# Patient Record
Sex: Female | Born: 1965 | Race: Black or African American | Hispanic: No | Marital: Single | Smoking: Current every day smoker
Health system: Southern US, Community
[De-identification: ages and names within clinical notes are randomized; demographics above are authoritative.]

## PROBLEM LIST (undated history)

## (undated) DIAGNOSIS — I10 Essential (primary) hypertension: Secondary | ICD-10-CM

## (undated) DIAGNOSIS — R51 Headache: Secondary | ICD-10-CM

## (undated) DIAGNOSIS — Z982 Presence of cerebrospinal fluid drainage device: Secondary | ICD-10-CM

## (undated) DIAGNOSIS — I639 Cerebral infarction, unspecified: Secondary | ICD-10-CM

## (undated) DIAGNOSIS — R519 Headache, unspecified: Secondary | ICD-10-CM

## (undated) HISTORY — PX: ABDOMINAL HYSTERECTOMY: SHX81

---

## 2014-12-30 ENCOUNTER — Encounter: Payer: Self-pay | Admitting: Medical Oncology

## 2014-12-30 ENCOUNTER — Emergency Department: Payer: Self-pay

## 2014-12-30 ENCOUNTER — Emergency Department
Admission: EM | Admit: 2014-12-30 | Discharge: 2014-12-30 | Disposition: A | Discharge status: Home / Self Care | Payer: Self-pay | Attending: Emergency Medicine | Admitting: Emergency Medicine

## 2014-12-30 DIAGNOSIS — F17200 Nicotine dependence, unspecified, uncomplicated: Secondary | ICD-10-CM | POA: Insufficient documentation

## 2014-12-30 DIAGNOSIS — G43009 Migraine without aura, not intractable, without status migrainosus: Secondary | ICD-10-CM

## 2014-12-30 DIAGNOSIS — I10 Essential (primary) hypertension: Secondary | ICD-10-CM | POA: Insufficient documentation

## 2014-12-30 DIAGNOSIS — Y658 Other specified misadventures during surgical and medical care: Secondary | ICD-10-CM | POA: Insufficient documentation

## 2014-12-30 DIAGNOSIS — G43909 Migraine, unspecified, not intractable, without status migrainosus: Secondary | ICD-10-CM | POA: Insufficient documentation

## 2014-12-30 DIAGNOSIS — T8509XA Other mechanical complication of ventricular intracranial (communicating) shunt, initial encounter: Secondary | ICD-10-CM | POA: Insufficient documentation

## 2014-12-30 HISTORY — DX: Essential (primary) hypertension: I10

## 2014-12-30 HISTORY — DX: Headache, unspecified: R51.9

## 2014-12-30 HISTORY — DX: Headache: R51

## 2014-12-30 HISTORY — DX: Presence of cerebrospinal fluid drainage device: Z98.2

## 2014-12-30 HISTORY — DX: Cerebral infarction, unspecified: I63.9

## 2014-12-30 LAB — COMPREHENSIVE METABOLIC PANEL
ALK PHOS: 69 U/L (ref 38–126)
ALT: 14 U/L (ref 14–54)
AST: 11 U/L — AB (ref 15–41)
Albumin: 4 g/dL (ref 3.5–5.0)
Anion gap: 7 (ref 5–15)
BILIRUBIN TOTAL: 0.5 mg/dL (ref 0.3–1.2)
BUN: 7 mg/dL (ref 6–20)
CALCIUM: 9.3 mg/dL (ref 8.9–10.3)
CO2: 25 mmol/L (ref 22–32)
Chloride: 106 mmol/L (ref 101–111)
Creatinine, Ser: 0.58 mg/dL (ref 0.44–1.00)
GFR calc Af Amer: >60 mL/min (ref >60)
Glucose, Bld: 235 mg/dL — ABNORMAL HIGH (ref 65–99)
POTASSIUM: 3.9 mmol/L (ref 3.5–5.1)
Sodium: 138 mmol/L (ref 135–145)
TOTAL PROTEIN: 7.9 g/dL (ref 6.5–8.1)

## 2014-12-30 LAB — CBC
HEMATOCRIT: 40.1 % (ref 35.0–47.0)
HEMOGLOBIN: 12.9 g/dL (ref 12.0–16.0)
MCH: 25.7 pg — AB (ref 26.0–34.0)
MCHC: 32.1 g/dL (ref 32.0–36.0)
MCV: 80 fL (ref 80.0–100.0)
Platelets: 227 10*3/uL (ref 150–440)
RBC: 5.01 MIL/uL (ref 3.80–5.20)
RDW: 14.7 % — ABNORMAL HIGH (ref 11.5–14.5)
WBC: 8.9 10*3/uL (ref 3.6–11.0)

## 2014-12-30 LAB — DIFFERENTIAL
BASOS ABS: 0.1 10*3/uL (ref 0–0.1)
Basophils Relative: 1 %
EOS ABS: 0.1 10*3/uL (ref 0–0.7)
EOS PCT: 1 %
LYMPHS ABS: 2.4 10*3/uL (ref 1.0–3.6)
LYMPHS PCT: 27 %
MONOS PCT: 6 %
Monocytes Absolute: 0.6 10*3/uL (ref 0.2–0.9)
Neutro Abs: 5.8 10*3/uL (ref 1.4–6.5)
Neutrophils Relative %: 65 %

## 2014-12-30 LAB — GLUCOSE, CAPILLARY: Glucose-Capillary: 216 mg/dL — ABNORMAL HIGH (ref 65–99)

## 2014-12-30 LAB — PROTIME-INR
INR: 0.97
Prothrombin Time: 13.1 seconds (ref 11.4–15.0)

## 2014-12-30 LAB — TROPONIN I

## 2014-12-30 LAB — APTT: aPTT: 31 seconds (ref 24–36)

## 2014-12-30 MED ORDER — DIPHENHYDRAMINE HCL 50 MG/ML IJ SOLN
25.0000 mg | Freq: Once | INTRAMUSCULAR | Status: AC
  Administered 2014-12-30: 25 mg via INTRAVENOUS
  Filled 2014-12-30: qty 1

## 2014-12-30 MED ORDER — METOCLOPRAMIDE HCL 5 MG/ML IJ SOLN
20.0000 mg | Freq: Once | INTRAVENOUS | Status: AC
  Administered 2014-12-30: 20 mg via INTRAVENOUS
  Filled 2014-12-30 (×2): qty 4

## 2014-12-30 MED ORDER — DEXAMETHASONE SODIUM PHOSPHATE 10 MG/ML IJ SOLN
10.0000 mg | Freq: Once | INTRAMUSCULAR | Status: AC
  Administered 2014-12-30: 10 mg via INTRAVENOUS
  Filled 2014-12-30: qty 1

## 2014-12-30 MED ORDER — KETOROLAC TROMETHAMINE 30 MG/ML IJ SOLN
15.0000 mg | Freq: Once | INTRAMUSCULAR | Status: AC
  Administered 2014-12-30: 15 mg via INTRAVENOUS
  Filled 2014-12-30: qty 1

## 2014-12-30 MED ORDER — SODIUM CHLORIDE 0.9 % IV BOLUS (SEPSIS)
1000.0000 mL | INTRAVENOUS | Status: AC
Start: 2014-12-30 — End: 2014-12-30
  Administered 2014-12-30: 1000 mL via INTRAVENOUS

## 2014-12-30 MED ORDER — METHYLPREDNISOLONE 4 MG PO TBPK: ORAL_TABLET | ORAL | Status: AC

## 2014-12-30 NOTE — Discharge Instructions (Signed)
As we discussed, we spoke by phone with Dr. Lanae Boast, a neurosurgeon at [facility] in [place].  Fortunately he explained that while close follow up in clinic is recommended, it is not necessary for you to be seen at [facility].  He recommended the prescription for prednisone that we are providing and that you call the neurosurgical clinic at 9:00am tomorrow to schedule the next available follow up appointment.  They will help you determine who was your original surgeon and who can help you follow up.  In the meantime, drink plenty of fluids to stay hydrated since the issue may be an overdrainage of your CSF - staying hydrated is important.  Take all your regular medications including over-the-counter ibuprofen and Tylenol as needed.   Migraine Headache A migraine headache is an intense, throbbing pain on one or both sides of your head. A migraine can last for 30 minutes to several hours. CAUSES  The exact cause of a migraine headache is not always known. However, a migraine may be caused when nerves in the brain become irritated and release chemicals that cause inflammation. This causes pain. Certain things may also trigger migraines, such as:  Alcohol.  Smoking.  Stress.  Menstruation.  Aged cheeses.  Foods or drinks that contain nitrates, glutamate, aspartame, or tyramine.  Lack of sleep.  Chocolate.  Caffeine.  Hunger.  Physical exertion.  Fatigue.  Medicines used to treat chest pain (nitroglycerine), birth control pills, estrogen, and some blood pressure medicines. SIGNS AND SYMPTOMS  Pain on one or both sides of your head.  Pulsating or throbbing pain.  Severe pain that prevents daily activities.  Pain that is aggravated by any physical activity.  Nausea, vomiting, or both.  Dizziness.  Pain with exposure to bright lights, loud noises, or activity.  General sensitivity to bright lights, loud noises, or smells. Before you get a migraine, you may get warning  signs that a migraine is coming (aura). An aura may include:  Seeing flashing lights.  Seeing bright spots, halos, or zigzag lines.  Having tunnel vision or blurred vision.  Having feelings of numbness or tingling.  Having trouble talking.  Having muscle weakness. DIAGNOSIS  A migraine headache is often diagnosed based on:  Symptoms.  Physical exam.  A CT scan or MRI of your head. These imaging tests cannot diagnose migraines, but they can help rule out other causes of headaches. TREATMENT Medicines may be given for pain and nausea. Medicines can also be given to help prevent recurrent migraines.  HOME CARE INSTRUCTIONS  Only take over-the-counter or prescription medicines for pain or discomfort as directed by your health care provider. The use of long-term narcotics is not recommended.  Lie down in a dark, quiet room when you have a migraine.  Keep a journal to find out what may trigger your migraine headaches. For example, write down:  What you eat and drink.  How much sleep you get.  Any change to your diet or medicines.  Limit alcohol consumption.  Quit smoking if you smoke.  Get 7-9 hours of sleep, or as recommended by your health care provider.  Limit stress.  Keep lights dim if bright lights bother you and make your migraines worse. SEEK IMMEDIATE MEDICAL CARE IF:   Your migraine becomes severe.  You have a fever.  You have a stiff neck.  You have vision loss.  You have muscular weakness or loss of muscle control.  You start losing your balance or have trouble walking.  You  feel faint or pass out.  You have severe symptoms that are different from your first symptoms. MAKE SURE YOU:   Understand these instructions.  Will watch your condition.  Will get help right away if you are not doing well or get worse.   This information is not intended to replace advice given to you by your health care provider. Make sure you discuss any questions you  have with your health care provider.   Document Released: 12/19/2004 Document Revised: 01/09/2014 Document Reviewed: 08/26/2012 Elsevier Interactive Patient Education 2016 Elsevier Inc.  Ventriculoperitoneal Shunt Home Guide A ventriculoperitoneal (VP) shunt is a small, plastic tube that is used to drain fluid from your brain into a sac in your belly (peritoneum). The peritoneum absorbs this fluid and gets rid of it. Normally, your brain releases the fluid that cushions your brain and spine (cerebrospinal fluid, CSF). Your brain then reabsorbs it through drainage channels. If your brain's drainage channels are not working properly, fluid builds up in your brain and needs to be redirected with a shunt. You may need a VP shunt if you have too much CSF inside your brain (hydrocephalus). Your health care provider determines how much fluid needs to be drained and adjusts the settings on the shunt. Some shunt settings cannot be changed after they have been set (nonprogrammable shunt). Others can be adjusted (programmable shunt) by your health care provider. You may feel the tube behind your ear and under your skin where it passes down your neck and your chest before it enters your belly (abdomen).  When you have a shunt, you need to take certain precautions and be aware of signs that may indicate a problem with the shunt. After your shunt is placed, it is important to have the following information with you:  The contact information for the surgeon who placed your shunt.  The name and type of VP shunt that you have. WHEN WILL I HAVE MY SHUNT REMOVED? Your shunt may be temporary or permanent, depending on your condition. For some people, a VP shunt is a lifelong device. WHAT PRECAUTIONS MUST I FOLLOW?  Contact your health care provider if you have a programmable shunt and need to get an MRI for any reason. This is very important because many programmable shunts are sensitive to magnets, which are involved  in MRIs.  Tell your health care provider about your shunt before you have surgery, especially abdominal surgery. You may need to take antibiotic medicines before having a procedure.   Do not wear tight-fitting hats or headgear.  Ask your health care provider which activities are safe for you. WHAT ARE THE WARNING SIGNS OF A SHUNT MALFUNCTION? A VP shunt can malfunction or become clogged. If the shunt is not working properly, it will not drain the CSF. This can cause an increase in brain pressure. It is important to know the warning signs of a shunt malfunction because they can start suddenly. Warning signs of a malfunction include:   A headache that gets worse over time.  Vomiting without cause.  Feeling sleepier than usual.  Loss of appetite.  Low energy.  Irritability.  Personality change or confusion.  Vision changes, such as blurry vision, double vision, or loss of vision.  Swelling of the skin that runs along the path of the shunt.  A return of your original symptoms.  Trouble walking.  Inability to control your bladder (urinary incontinence).  Having a seizure. WHAT ARE THE WARNING SIGNS OF A SHUNT INFECTION? If  germs (bacteria) get into the tissue around the shunt, you can develop an infection. This can cause your shunt to stop working properly. Watch for signs of infection, such as:  Fever.  Redness or swelling of the skin along the shunt path.  Pain around the shunt or shunt tubing.  A headache or a stiff neck.  Nausea or vomiting. WHEN SHOULD I SEEK IMMEDIATE MEDICAL CARE?  When you have a VP shunt, seek medical care right away if:  You are sleepier than usual or have trouble waking up.  You vomit for no reason.  You have a fever.  You notice redness or swelling along the shunt path.  You have a headache that is getting worse.  You start to twitch or shake (seizure).  You develop vision problems.  You lose coordination or balance.  You  become irritable or start to behave abnormally.   This information is not intended to replace advice given to you by your health care provider. Make sure you discuss any questions you have with your health care provider.   Document Released: 07/08/2004 Document Revised: 01/09/2014 Document Reviewed: 05/28/2013 Elsevier Interactive Patient Education [employer] Elsevier Inc.

## 2014-12-30 NOTE — ED Notes (Signed)
Headache X 6 hours. Pt hx of migraines, "this one is bad". Sensitivity to noise and light.

## 2014-12-30 NOTE — ED Provider Notes (Signed)
[facility] Emergency Department Provider Note  ____________________________________________  Time seen: Approximately 4:12 PM  I have reviewed the triage vital signs and the nursing notes.   HISTORY  Chief Complaint Numbness    HPI Evelyn Huynh is a 49 y.o. 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  She presents today with several days of gradual onset worsening headache that feels like prior migraines that she experienced when she had pseudotumor.  She states that it has developed to severe and is accompanied with some vague and various numbness in her extremities going back to nearly 3 days ago.  She reports that she has had strokes in the past, the last one being 2 years ago.  She states that her migraine is severe, global, worse behind the eyes, and accompanied with photophobia, sensitivity to light, and nausea.   Past Medical History  Diagnosis Date  . VP (ventriculoperitoneal) shunt status   . Hypertension   . Generalized headaches   . Stroke [facility])     There are no active problems to display for this patient.   Past Surgical History  Procedure Laterality Date  . Abdominal hysterectomy      Current Outpatient Rx  Name  Route  Sig  Dispense  Refill  . aspirin-acetaminophen-caffeine (EXCEDRIN MIGRAINE) 250-250-65 MG tablet   Oral   Take 4 tablets by mouth every 6 (six) hours as needed for headache.         . methylPREDNISolone (MEDROL DOSEPAK) 4 MG TBPK tablet      Take tablets by mouth according to package instructions based on the recommended daily dosing.   21 tablet   0     Allergies Review of patient's allergies indicates no known allergies.  No family history on file.  Social History Social History  Substance Use Topics  . Smoking status: Current Every Day Smoker  . Smokeless tobacco: None  . Alcohol Use: No    Review of  Systems Constitutional: No fever/chills Eyes: Sensitivity to light and blurry vision ENT: No sore throat. Cardiovascular: Denies chest pain. Respiratory: Denies shortness of breath. Gastrointestinal: No abdominal pain.  nausea, no vomiting.  No diarrhea.  No constipation. Genitourinary: Negative for dysuria. Musculoskeletal: Negative for back pain. Skin: Negative for rash. Neurological: Severe global headache, gradual onset, feels similar to prior migraines.  Accompanied by several days of various and nonspecific numbness and tingling/weakness in her extremities.  10-point ROS otherwise negative.  ____________________________________________   PHYSICAL EXAM:  VITAL SIGNS: ED Triage Vitals  Enc Vitals Group     BP 12/30/14 1233 164/101 mmHg     Pulse Rate 12/30/14 1233 97     Resp 12/30/14 1233 18     Temp 12/30/14 1233 97.8 F (36.6 C)     Temp Source 12/30/14 1233 Oral     SpO2 12/30/14 1233 97 %     Weight 12/30/14 1233 200 lb (90.719 kg)     Height 12/30/14 1233  (1.651 m)     Head Cir --      Peak Flow --      Pain Score 12/30/14 1233 10     Pain Loc --      Pain Edu? --      Excl. in GC? --     Constitutional: Alert and oriented. Well appearing and in no acute distress. Eyes: Conjunctivae are normal. PERRL. EOMI. photophobia. Head: Atraumatic. Nose:  No congestion/rhinnorhea. Mouth/Throat: Mucous membranes are moist.  Oropharynx non-erythematous. Neck: No stridor.   Cardiovascular: Normal rate, regular rhythm. Grossly normal heart sounds.  Good peripheral circulation. Respiratory: Normal respiratory effort.  No retractions. Lungs CTAB. Gastrointestinal: Obese, Soft and nontender. No distention. No abdominal bruits. No CVA tenderness. Musculoskeletal: No lower extremity tenderness nor edema.  No joint effusions. Neurologic:  Normal speech and language. No gross focal neurologic deficits are appreciated.  Skin:  Skin is warm, dry and intact. No rash  noted. Psychiatric: Mood and affect are normal. Speech and behavior are normal.  ____________________________________________   LABS (all labs ordered are listed, but only abnormal results are displayed)  Labs Reviewed  CBC - Abnormal; Notable for the following:    MCH 25.7 (*)    RDW 14.7 (*)    All other components within normal limits  COMPREHENSIVE METABOLIC PANEL - Abnormal; Notable for the following:    Glucose, Bld 235 (*)    AST 11 (*)    All other components within normal limits  GLUCOSE, CAPILLARY - Abnormal; Notable for the following:    Glucose-Capillary 216 (*)    All other components within normal limits  PROTIME-INR  APTT  DIFFERENTIAL  TROPONIN I  CBG MONITORING, ED   ____________________________________________  EKG  ED ECG REPORT I, Leonidas Boateng, the attending physician, personally viewed and interpreted this ECG.  Date: 12/30/2014 EKG Time: 12:34 Rate: 96 Rhythm: normal sinus rhythm QRS Axis: normal Intervals: normal ST/T Wave abnormalities: normal Conduction Disutrbances: none Narrative Interpretation: unremarkable  ____________________________________________  RADIOLOGY Aura CampsI, Leiya Keesey, personally discussed these images and results by phone with the on-call radiologist and used this discussion as part of my medical decision making.    Ct Head Wo Contrast  12/30/2014  CLINICAL DATA:  RIGHT foot numbness which began 36 hours ago. This is accompanied by LEFT index finger numbness and RIGHT hand weakness. History of CVA. History of hydrocephalus with shunt. EXAM: CT HEAD WITHOUT CONTRAST TECHNIQUE: Contiguous axial images were obtained from the base of the skull through the vertex without intravenous contrast. COMPARISON:  No priors are available for comparison on the [facility] timeline. FINDINGS: No evidence for acute infarction, hemorrhage, mass lesion, hydrocephalus, or extra-axial fluid. Normal cerebral volume. No white matter disease. A shunt  catheter has been placed from a RIGHT occipital approach. The tip lies within the lateral ventricles. The ventricles are slit-like. Correlate clinically for overdrainage CSF symptoms. The calvarium is otherwise intact. There is no sinus or mastoid fluid. IMPRESSION: No acute intracranial findings are evident. Specifically, no concerning features for stroke or hemorrhage. Status post ventriculoperitoneal shunting, with imaging features suggestive of overdrainage CSF. Correlation with prior imaging studies and/or neurosurgical consultation for shunt assessment may be warranted. Electronically Signed   By: Elsie StainJohn T Curnes M.D.   On: 12/30/2014 13:09    ____________________________________________   PROCEDURES  Procedure(s) performed: None  Critical Care performed: No ____________________________________________   INITIAL IMPRESSION / ASSESSMENT AND PLAN / ED COURSE  Pertinent labs & imaging results that were available during my care of the patient were reviewed by me and considered in my medical decision making (see chart for details).  The patient's head CT is concerning for overt drainage via the VP shunt with slitlike ventricles which is also consistent with her headache and nonspecific neurological complaints.  I called and spoke by phone with Dr. Benard Rinkurnes, radiologist who interpreted the CT scan, and he feels that the pattern he is saying is very consistent with over  drainage and also make sense with the chief complaint.  She has no focal neurological deficits at this time and her largest complaint is the severe headache.  He was originally going to drive herself to [facility] that she was concerned to drive that far because of her blurry vision and the severity of her headache.  I am going to treat her with IV fluids and my typical nonnarcotic migraine cocktail but given the nature of her symptoms if her headache is not improved I will try morphine.  There is no indication for an LP and there is no  indication at this time that she has an infectious process.  We are currently in the process of trying to reach the neurosurgeon on call at [facility] to discuss the case further.  ----------------------------------------- 8:11 PM on 12/30/2014 -----------------------------------------  I spoke extensively by phone with Dr. Lanae Boast, the on-call neurosurgeon at [facility].  He assured me that the patient does not need emergent transfer to [facility] and that she can be seen in clinic in follow-up.  He recommended discharging her with a Medrol Dosepak in addition to the Decadron that I gave her in the emergency department.  He told me to stress to her the importance of calling in the morning to schedule the next outpatient visit which I did.  The patient feels much better after migraine treatment and she had a long nap while awaiting the neurosurgery consultation.  She ambulated to and from the bathroom with no difficulty and no instability.  I believe she is safe and appropriate for outpatient follow-up.  I gave my usual and customary return precautions.    ____________________________________________  FINAL CLINICAL IMPRESSION(S) / ED DIAGNOSES  Final diagnoses:  Malfunction of ventriculoperitoneal shunt, initial encounter (HCC)  Nonintractable migraine, unspecified migraine type      NEW MEDICATIONS STARTED DURING THIS VISIT:  New Prescriptions   METHYLPREDNISOLONE (MEDROL DOSEPAK) 4 MG TBPK TABLET    Take tablets by mouth according to package instructions based on the recommended daily dosing.     Loleta Rose, MD 12/30/14 2012

## 2014-12-30 NOTE — ED Notes (Signed)
Pt reports that she began having rt foot numbness , lt hand index finger numbness and rt hand weakness 36 hrs ago. Hx of  2 CVA's in past. Last one was 2014. Pt also reports headache with hx of VP shunt.

## 2017-04-12 IMAGING — CT CT HEAD W/O CM
1 series · 15 of 30 positions shown, 19 images · non-contrast
Comparison: No priors are available for comparison on the [HOSPITAL]
timeline.

CLINICAL DATA: RIGHT foot numbness which began 36 hours ago. This
is accompanied by LEFT index finger numbness and RIGHT hand
weakness. History of CVA. History of hydrocephalus with shunt.

EXAM:
CT HEAD WITHOUT CONTRAST
TECHNIQUE: Contiguous axial images were obtained from the base of the skull
through the vertex without intravenous contrast.

[Series 2: head wo · axial · 0.41mm/px · z∈[+301,+445]mm · 15 of 36 slices shown, 19 images]
[im 2/36  brain]
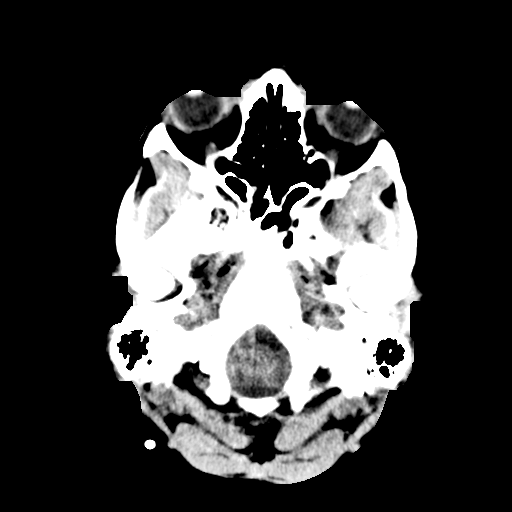
[im 2/36  bone]
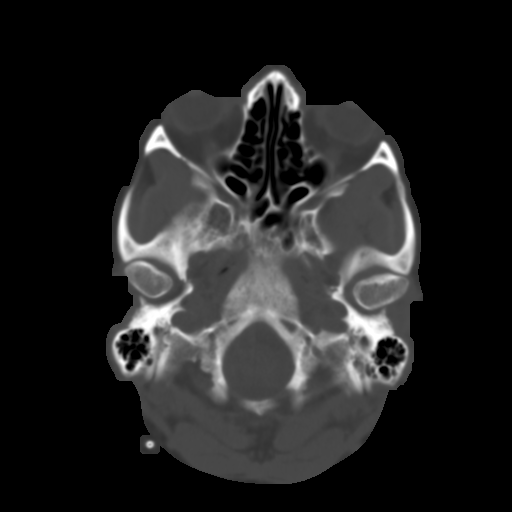
[im 4/36  brain]
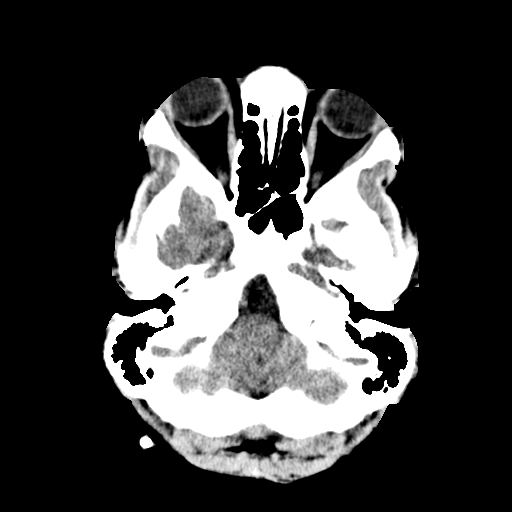
[im 7/36  brain]
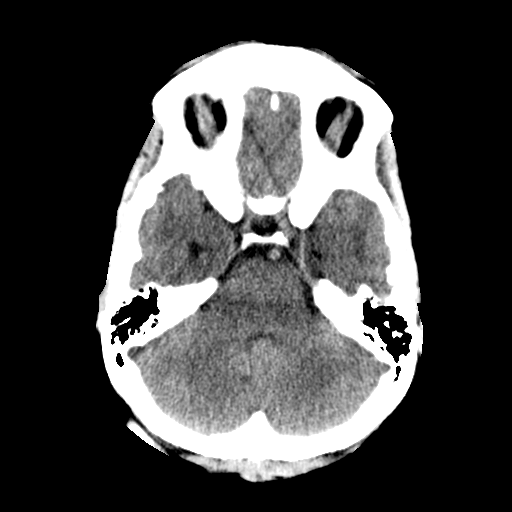
[im 9/36  brain]
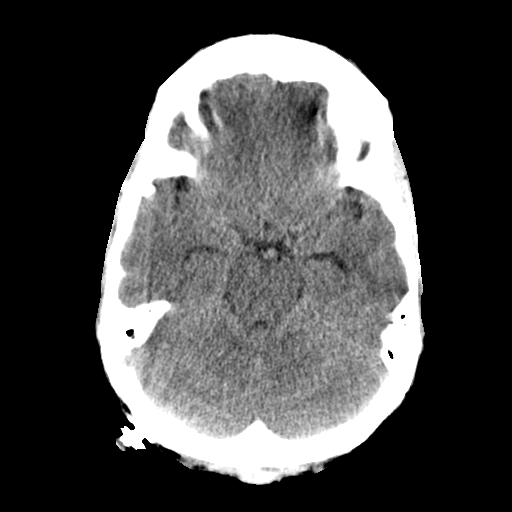
[im 11/36  brain]
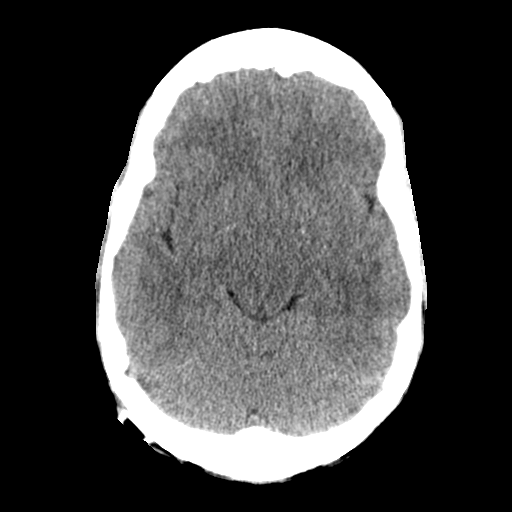
[im 11/36  bone]
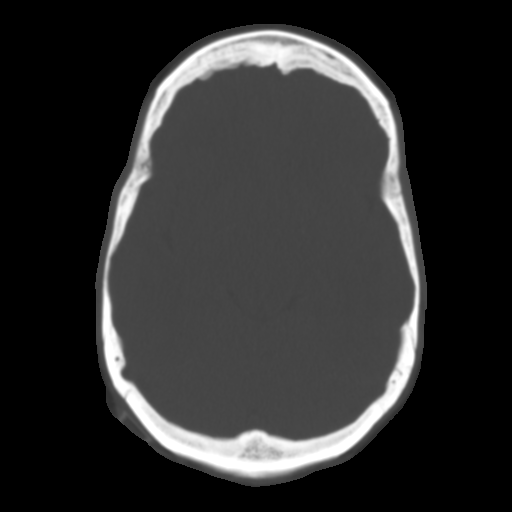
[im 14/36  brain]
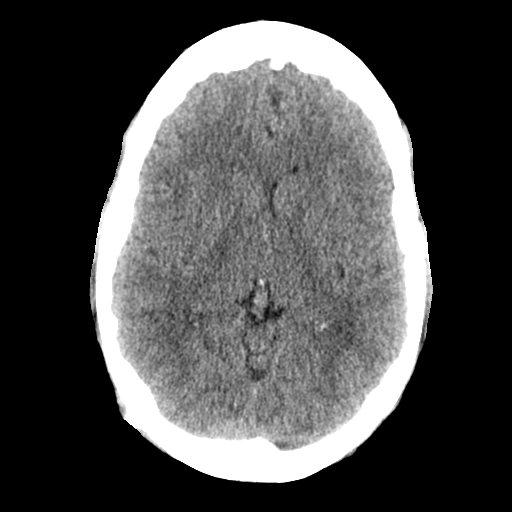
[im 16/36  brain]
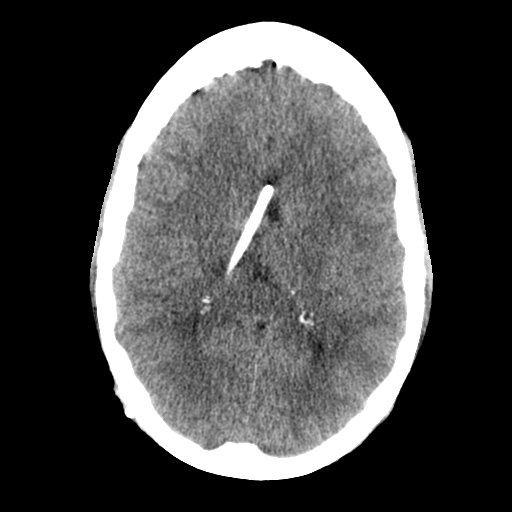
[im 19/36  brain]
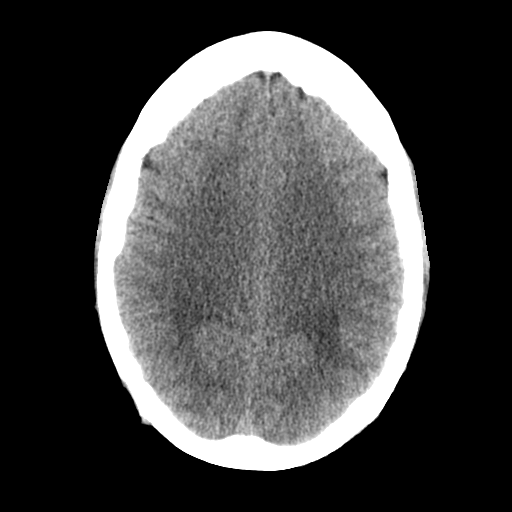
[im 20/36  brain]
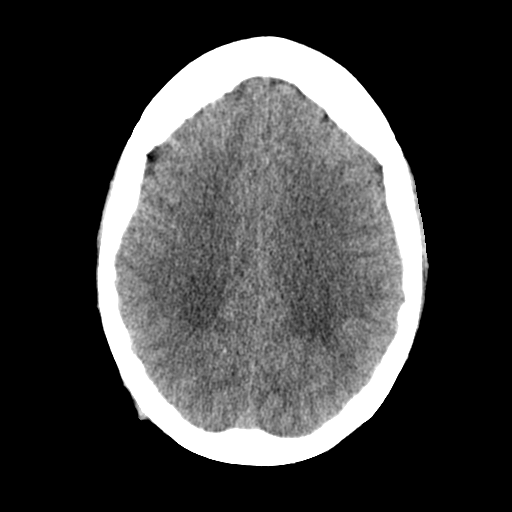
[im 20/36  bone]
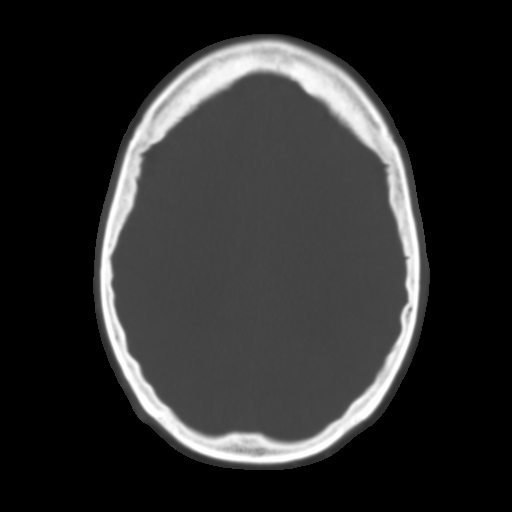
[im 22/36  brain]
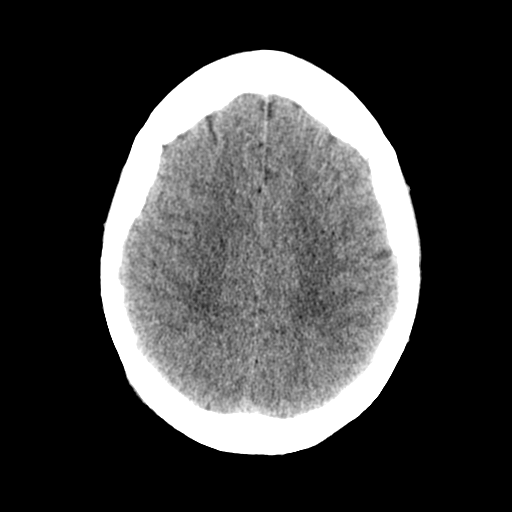
[im 25/36  brain]
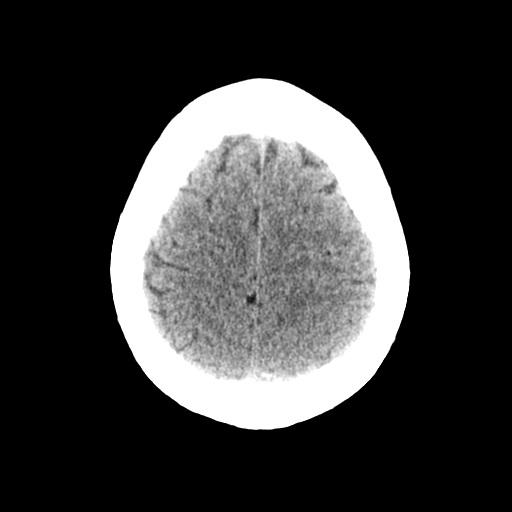
[im 27/36  brain]
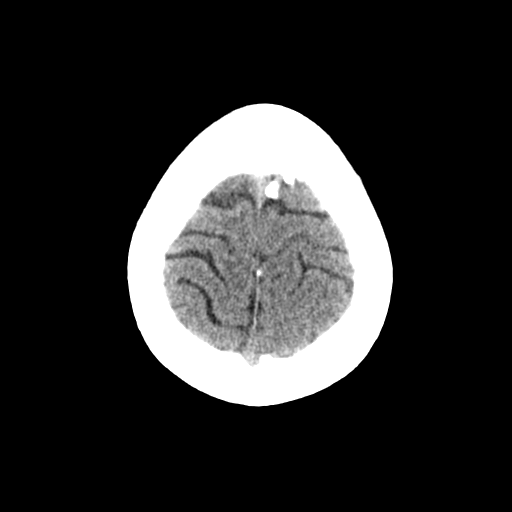
[im 29/36  brain]
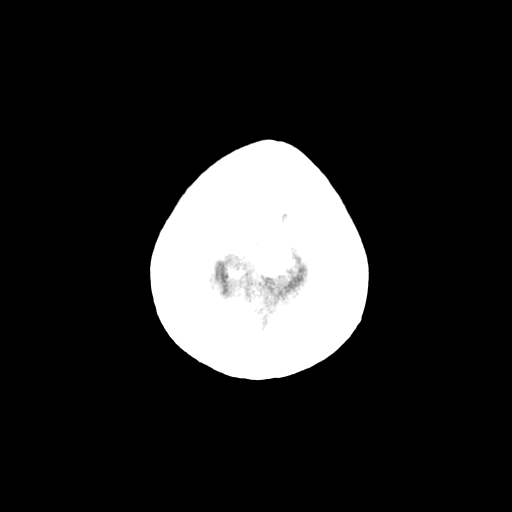
[im 29/36  bone]
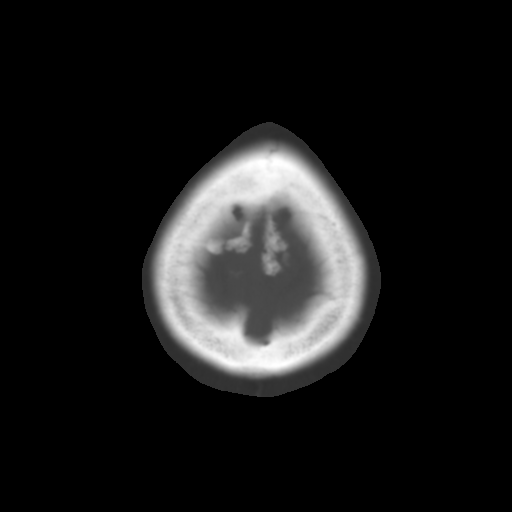
[im 32/36  brain]
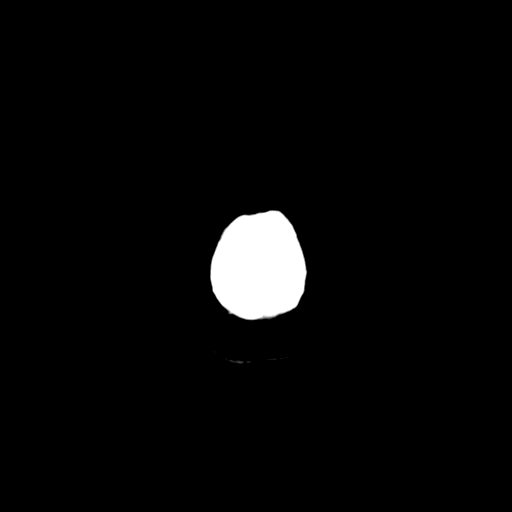
[im 34/36  brain]
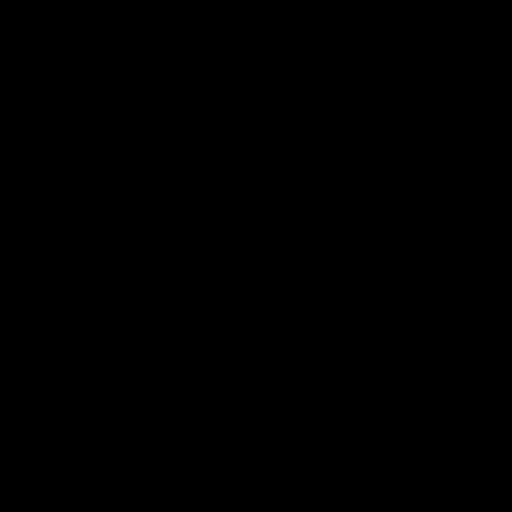

[15 of 30 positions shown; findings below may reference images not displayed]

FINDINGS: No evidence for acute infarction, hemorrhage, mass lesion,
hydrocephalus, or extra-axial fluid. Normal cerebral volume. No
white matter disease.

A shunt catheter has been placed from a RIGHT occipital approach.
The tip lies within the lateral ventricles. The ventricles are
slit-like. Correlate clinically for overdrainage CSF symptoms.

The calvarium is otherwise intact. There is no sinus or mastoid
fluid.
IMPRESSION: No acute intracranial findings are evident. Specifically, no
concerning features for stroke or hemorrhage.

Status post ventriculoperitoneal shunting, with imaging features
suggestive of overdrainage CSF.

Correlation with prior imaging studies and/or neurosurgical
consultation for shunt assessment may be warranted.
